# Patient Record
Sex: Male | Born: 1995 | Hispanic: Yes | Marital: Single
Health system: Southern US, Community
[De-identification: ages and names within clinical notes are randomized; demographics above are authoritative.]

---

## 2019-01-15 ENCOUNTER — Emergency Department (HOSPITAL_COMMUNITY): Payer: Self-pay

## 2019-01-15 ENCOUNTER — Emergency Department (HOSPITAL_COMMUNITY)
Admission: EM | Admit: 2019-01-15 | Discharge: 2019-01-16 | Disposition: A | Discharge status: Home / Self Care | Payer: Self-pay | Attending: Emergency Medicine | Admitting: Emergency Medicine

## 2019-01-15 ENCOUNTER — Encounter (HOSPITAL_COMMUNITY): Payer: Self-pay | Admitting: Emergency Medicine

## 2019-01-15 DIAGNOSIS — Y9389 Activity, other specified: Secondary | ICD-10-CM | POA: Insufficient documentation

## 2019-01-15 DIAGNOSIS — Y999 Unspecified external cause status: Secondary | ICD-10-CM | POA: Insufficient documentation

## 2019-01-15 DIAGNOSIS — S0990XA Unspecified injury of head, initial encounter: Secondary | ICD-10-CM | POA: Insufficient documentation

## 2019-01-15 DIAGNOSIS — Y92410 Unspecified street and highway as the place of occurrence of the external cause: Secondary | ICD-10-CM | POA: Insufficient documentation

## 2019-01-15 DIAGNOSIS — S301XXA Contusion of abdominal wall, initial encounter: Secondary | ICD-10-CM | POA: Insufficient documentation

## 2019-01-15 DIAGNOSIS — S20219A Contusion of unspecified front wall of thorax, initial encounter: Secondary | ICD-10-CM | POA: Insufficient documentation

## 2019-01-15 DIAGNOSIS — S3991XA Unspecified injury of abdomen, initial encounter: Secondary | ICD-10-CM | POA: Insufficient documentation

## 2019-01-15 DIAGNOSIS — S299XXA Unspecified injury of thorax, initial encounter: Secondary | ICD-10-CM | POA: Insufficient documentation

## 2019-01-15 LAB — URINALYSIS, ROUTINE W REFLEX MICROSCOPIC
Bilirubin Urine: NEGATIVE
Glucose, UA: NEGATIVE mg/dL
Hgb urine dipstick: NEGATIVE
Ketones, ur: 5 mg/dL — AB
Leukocytes,Ua: NEGATIVE
Nitrite: NEGATIVE
Protein, ur: NEGATIVE mg/dL
Specific Gravity, Urine: 1.018 (ref 1.005–1.030)
pH: 6 (ref 5.0–8.0)

## 2019-01-15 LAB — CBC
HCT: 43.9 % (ref 39.0–52.0)
Hemoglobin: 14.7 g/dL (ref 13.0–17.0)
MCH: 30.9 pg (ref 26.0–34.0)
MCHC: 33.5 g/dL (ref 30.0–36.0)
MCV: 92.4 fL (ref 80.0–100.0)
Platelets: 285 10*3/uL (ref 150–400)
RBC: 4.75 MIL/uL (ref 4.22–5.81)
RDW: 11.9 % (ref 11.5–15.5)
WBC: 7.1 10*3/uL (ref 4.0–10.5)
nRBC: 0 % (ref 0.0–0.2)

## 2019-01-15 LAB — COMPREHENSIVE METABOLIC PANEL
ALT: 173 U/L — ABNORMAL HIGH (ref 0–44)
AST: 120 U/L — ABNORMAL HIGH (ref 15–41)
Albumin: 4.2 g/dL (ref 3.5–5.0)
Alkaline Phosphatase: 80 U/L (ref 38–126)
Anion gap: 17 — ABNORMAL HIGH (ref 5–15)
BUN: 9 mg/dL (ref 6–20)
CO2: 16 mmol/L — ABNORMAL LOW (ref 22–32)
Calcium: 9 mg/dL (ref 8.9–10.3)
Chloride: 104 mmol/L (ref 98–111)
Creatinine, Ser: 1 mg/dL (ref 0.61–1.24)
GFR calc Af Amer: 53 mL/min — ABNORMAL LOW (ref >60)
GFR calc non Af Amer: 45 mL/min — ABNORMAL LOW (ref >60)
Glucose, Bld: 165 mg/dL — ABNORMAL HIGH (ref 70–99)
Potassium: 3.8 mmol/L (ref 3.5–5.1)
Sodium: 137 mmol/L (ref 135–145)
Total Bilirubin: 0.4 mg/dL (ref 0.3–1.2)
Total Protein: 7.3 g/dL (ref 6.5–8.1)

## 2019-01-15 LAB — RAPID URINE DRUG SCREEN, HOSP PERFORMED
Amphetamines: NOT DETECTED
Barbiturates: NOT DETECTED
Benzodiazepines: NOT DETECTED
Cocaine: NOT DETECTED
Opiates: NOT DETECTED
Tetrahydrocannabinol: NOT DETECTED

## 2019-01-15 LAB — I-STAT CHEM 8, ED
BUN: 10 mg/dL (ref 6–20)
Calcium, Ion: 0.97 mmol/L — ABNORMAL LOW (ref 1.15–1.40)
Chloride: 110 mmol/L (ref 98–111)
Creatinine, Ser: 1.3 mg/dL — ABNORMAL HIGH (ref 0.61–1.24)
Glucose, Bld: 165 mg/dL — ABNORMAL HIGH (ref 70–99)
HCT: 43 % (ref 39.0–52.0)
Hemoglobin: 14.6 g/dL (ref 13.0–17.0)
Potassium: 3.8 mmol/L (ref 3.5–5.1)
Sodium: 138 mmol/L (ref 135–145)
TCO2: 19 mmol/L — ABNORMAL LOW (ref 22–32)

## 2019-01-15 LAB — PROTIME-INR
INR: 1 (ref 0.8–1.2)
Prothrombin Time: 12.6 seconds (ref 11.4–15.2)

## 2019-01-15 LAB — LACTIC ACID, PLASMA: Lactic Acid, Venous: 2.8 mmol/L (ref 0.5–1.9)

## 2019-01-15 LAB — ETHANOL: Alcohol, Ethyl (B): 161 mg/dL — ABNORMAL HIGH (ref <10)

## 2019-01-15 MED ORDER — ONDANSETRON HCL 4 MG/2ML IJ SOLN
4.0000 mg | Freq: Once | INTRAMUSCULAR | Status: AC
  Administered 2019-01-15: 4 mg via INTRAVENOUS
  Filled 2019-01-15: qty 2

## 2019-01-15 MED ORDER — IOHEXOL 300 MG/ML  SOLN
100.0000 mL | Freq: Once | INTRAMUSCULAR | Status: AC | PRN
  Administered 2019-01-15: 100 mL via INTRAVENOUS

## 2019-01-15 MED ORDER — SODIUM CHLORIDE 0.9 % IV BOLUS
1000.0000 mL | Freq: Once | INTRAVENOUS | Status: AC
  Administered 2019-01-15: 1000 mL via INTRAVENOUS

## 2019-01-15 MED ORDER — MORPHINE SULFATE (PF) 4 MG/ML IV SOLN
4.0000 mg | Freq: Once | INTRAVENOUS | Status: AC
  Administered 2019-01-15: 4 mg via INTRAVENOUS
  Filled 2019-01-15: qty 1

## 2019-01-15 NOTE — ED Notes (Signed)
Pts friend Shanon Brow [phone] brought copy of ID and would like update.

## 2019-01-15 NOTE — ED Notes (Signed)
Pt awake and speaking to [employer] at bedside
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
## 2019-01-15 NOTE — ED Notes (Signed)
C-collar taken off patient with permission from [facility]
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
## 2019-01-15 NOTE — Discharge Instructions (Addendum)

## 2019-01-16 LAB — SAMPLE TO BLOOD BANK

## 2019-01-16 LAB — CDS SEROLOGY
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
